# Patient Record
Sex: Female | Born: 2000 | Race: Black or African American | Hispanic: No
Health system: Southern US, Community
[De-identification: ages and names within clinical notes are randomized; demographics above are authoritative.]

## PROBLEM LIST (undated history)

## (undated) ENCOUNTER — Emergency Department (HOSPITAL_COMMUNITY): Admission: EM | Payer: Self-pay | Source: Home / Self Care

## (undated) ENCOUNTER — Ambulatory Visit (HOSPITAL_COMMUNITY): Source: Home / Self Care

## (undated) DIAGNOSIS — Z789 Other specified health status: Secondary | ICD-10-CM

## (undated) HISTORY — PX: DILATION AND CURETTAGE OF UTERUS: SHX78

## (undated) HISTORY — PX: WISDOM TOOTH EXTRACTION: SHX21

## (undated) HISTORY — DX: Other specified health status: Z78.9

---
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
## 2020-04-21 ENCOUNTER — Other Ambulatory Visit: Payer: Self-pay

## 2020-07-30 ENCOUNTER — Ambulatory Visit: Payer: Medicaid Other | Attending: Internal Medicine

## 2020-07-30 DIAGNOSIS — Z23 Encounter for immunization: Secondary | ICD-10-CM

## 2020-07-30 NOTE — Progress Notes (Signed)
   Covid-19 Vaccination Clinic  Name:  [name]    MRN: [id number] DOB: [birth year]  07/30/2020  [name] was observed post Covid-19 immunization for 15 minutes without incident. She was provided with Vaccine Information Sheet and instruction to access the V-Safe system.   [name] was instructed to call 911 with any severe reactions post vaccine: Marland Kitchen Difficulty breathing  . Swelling of face and throat  . A fast heartbeat  . A bad rash all over body  . Dizziness and weakness   Immunizations Administered    Name Date Dose VIS Date Route   Moderna COVID-19 Vaccine 07/30/2020  3:42 PM 0.5 mL 06/27/2020 Intramuscular   Manufacturer: Gala Murdoch   Lot: [id number]   NDC: 00923-300-76

## 2020-08-27 ENCOUNTER — Ambulatory Visit: Payer: Medicaid Other | Attending: Internal Medicine

## 2020-08-27 DIAGNOSIS — Z23 Encounter for immunization: Secondary | ICD-10-CM

## 2020-08-27 NOTE — Progress Notes (Signed)
   Covid-19 Vaccination Clinic  Name:  Ann Lin    MRN: [id number] DOB: 2000/12/08  08/27/2020  [name] was observed post Covid-19 immunization for 15 minutes 15 minutes observation without incident. She was provided with Vaccine Information Sheet and instruction to access the V-Safe system.   [name] was instructed to call 911 with any severe reactions post vaccine: Marland Kitchen Difficulty breathing  . Swelling of face and throat  . A fast heartbeat  . A bad rash all over body  . Dizziness and weakness   Immunizations Administered    Name Date Dose VIS Date Route   Moderna COVID-19 Vaccine 08/27/2020  1:28 PM 0.5 mL 06/27/2020 Intramuscular   Manufacturer: Gala Murdoch   Lot: [id number]   NDC: 67014-103-01

## 2020-09-04 ENCOUNTER — Emergency Department (HOSPITAL_COMMUNITY)
Admission: EM | Admit: 2020-09-04 | Discharge: 2020-09-04 | Disposition: A | Discharge status: Home / Self Care | Payer: Medicaid Other | Attending: Emergency Medicine | Admitting: Emergency Medicine

## 2020-09-04 ENCOUNTER — Other Ambulatory Visit: Payer: Self-pay

## 2020-09-04 DIAGNOSIS — Z202 Contact with and (suspected) exposure to infections with a predominantly sexual mode of transmission: Secondary | ICD-10-CM | POA: Diagnosis not present

## 2020-09-04 DIAGNOSIS — Z20822 Contact with and (suspected) exposure to covid-19: Secondary | ICD-10-CM | POA: Insufficient documentation

## 2020-09-04 DIAGNOSIS — R509 Fever, unspecified: Secondary | ICD-10-CM | POA: Diagnosis not present

## 2020-09-04 DIAGNOSIS — N898 Other specified noninflammatory disorders of vagina: Secondary | ICD-10-CM | POA: Diagnosis not present

## 2020-09-04 DIAGNOSIS — J029 Acute pharyngitis, unspecified: Secondary | ICD-10-CM | POA: Insufficient documentation

## 2020-09-04 DIAGNOSIS — R519 Headache, unspecified: Secondary | ICD-10-CM | POA: Insufficient documentation

## 2020-09-04 DIAGNOSIS — Z711 Person with feared health complaint in whom no diagnosis is made: Secondary | ICD-10-CM

## 2020-09-04 LAB — WET PREP, GENITAL
Clue Cells Wet Prep HPF POC: NONE SEEN
Sperm: NONE SEEN
Trich, Wet Prep: NONE SEEN
Yeast Wet Prep HPF POC: NONE SEEN

## 2020-09-04 LAB — HIV ANTIBODY (ROUTINE TESTING W REFLEX): HIV Screen 4th Generation wRfx: NONREACTIVE

## 2020-09-04 LAB — URINALYSIS, ROUTINE W REFLEX MICROSCOPIC
Bilirubin Urine: NEGATIVE
Glucose, UA: NEGATIVE mg/dL
Ketones, ur: 20 mg/dL — AB
Nitrite: NEGATIVE
Protein, ur: 100 mg/dL — AB
Specific Gravity, Urine: 1.032 — ABNORMAL HIGH (ref 1.005–1.030)
pH: 5 (ref 5.0–8.0)

## 2020-09-04 LAB — I-STAT BETA HCG BLOOD, ED (MC, WL, AP ONLY): I-stat hCG, quantitative: <5 m[IU]/mL (ref <5)

## 2020-09-04 LAB — RESP PANEL BY RT-PCR (FLU A&B, COVID) ARPGX2
Influenza A by PCR: NEGATIVE
Influenza B by PCR: NEGATIVE
SARS Coronavirus 2 by RT PCR: NEGATIVE

## 2020-09-04 MED ORDER — DOXYCYCLINE HYCLATE 100 MG PO TABS: 100.0000 mg | ORAL_TABLET | Freq: Once | ORAL | Status: DC

## 2020-09-04 MED ORDER — ONDANSETRON 4 MG PO TBDP
8.0000 mg | ORAL_TABLET | Freq: Once | ORAL | Status: AC
  Administered 2020-09-04: 22:00:00 8 mg via ORAL
  Filled 2020-09-04: qty 2

## 2020-09-04 MED ORDER — DOXYCYCLINE HYCLATE 100 MG PO CAPS: 100.0000 mg | ORAL_CAPSULE | Freq: Two times a day (BID) | ORAL | 0 refills | Status: AC

## 2020-09-04 MED ORDER — VALACYCLOVIR HCL 1 G PO TABS: 1000.0000 mg | ORAL_TABLET | Freq: Three times a day (TID) | ORAL | 0 refills | Status: DC

## 2020-09-04 MED ORDER — CEFTRIAXONE SODIUM 500 MG IJ SOLR
500.0000 mg | Freq: Once | INTRAMUSCULAR | Status: AC
  Administered 2020-09-04: 22:00:00 500 mg via INTRAMUSCULAR
  Filled 2020-09-04: qty 500

## 2020-09-04 MED ORDER — LIDOCAINE HCL URETHRAL/MUCOSAL 2 % EX GEL: 1.0000 "application " | CUTANEOUS | 0 refills | Status: DC | PRN

## 2020-09-04 MED ORDER — LIDOCAINE HCL (PF) 1 % IJ SOLN
1.0000 mL | Freq: Once | INTRAMUSCULAR | Status: AC
  Administered 2020-09-04: 22:00:00 1 mL

## 2020-09-04 MED ORDER — VALACYCLOVIR HCL 500 MG PO TABS: 1000.0000 mg | ORAL_TABLET | Freq: Once | ORAL | Status: DC

## 2020-09-04 MED ORDER — FLUCONAZOLE 150 MG PO TABS
150.0000 mg | ORAL_TABLET | Freq: Once | ORAL | Status: AC
  Administered 2020-09-04: 22:00:00 150 mg via ORAL
  Filled 2020-09-04: qty 1

## 2020-09-04 NOTE — ED Triage Notes (Signed)
Pt reports fever, chills, sore throat, headache x 2 days. Also endorses vaginal pain and discharge and would like STD testing.

## 2020-09-04 NOTE — ED Provider Notes (Signed)
[facility] EMERGENCY DEPARTMENT Provider Note   CSN: [id number] Arrival date & time: 09/04/20  1608     History Chief Complaint  Patient presents with  . Sore Throat  . Exposure to STD    Ann Lin is a 19 y.o. female with no known past medical history.  HPI Patient presents to emergency department today with chief complaint of sore throat and exposure to STD x2 days.  Patient states she has had subjective fever and chills, sore throat and headache.  She states those symptoms solved earlier today without intervention.  Asking for a Covid test.  No known sick contacts or Covid exposures.  She is reporting vaginal discharge x2 days as well.  She is concerned for possible STD.  She is sexually active with 1 female partner with protection.  She states she noticed bumps on her vagina and she has associated dysuria.  She has thick white vaginal discharge also.  She has not tried any over-the-counter medications for symptoms prior to arrival.  No history of similar symptoms. Denies abdominal pain, pelvic pain, abnormal vaginal bleeding, gross hematuria, urinary frequency, rash.    No past medical history on file.  There are no problems to display for this patient.   No past surgical history on file.   OB History   No obstetric history on file.     No family history on file.  Social History   Tobacco Use  . Smoking status: Never Smoker  . Smokeless tobacco: Never Used  Substance Use Topics  . Alcohol use: No    Home Medications Prior to Admission medications   Medication Sig Start Date End Date Taking? Authorizing Provider  doxycycline (VIBRAMYCIN) 100 MG capsule Take 1 capsule (100 mg total) by mouth 2 (two) times daily for 7 days. 09/04/20 09/11/20 Yes Walisiewicz, Saiquan Hands E, PA-C  lidocaine (XYLOCAINE) 2 % jelly Apply 1 application topically as needed. 09/04/20  Yes Walisiewicz, Nolita Kutter E, PA-C  valACYclovir (VALTREX) 1000 MG tablet Take 1 tablet  (1,000 mg total) by mouth 3 (three) times daily. 09/04/20  Yes Walisiewicz, Zanaya Baize E, PA-C  Acetaminophen (TYLENOL PO) Take by mouth.    [provider]  dicyclomine (BENTYL) 10 MG capsule Take 1 capsule (10 mg total) by mouth 4 (four) times daily as needed (abdominal pain). 05/01/16 05/15/16  Phineas Semen, MD    Allergies    Patient has no known allergies.  Review of Systems   Review of Systems All other systems are reviewed and are negative for acute change except as noted in the HPI.  Physical Exam Updated Vital Signs BP 112/77   Pulse (!) 117   Temp 99.6 F (37.6 C) (Oral)   Resp 17   LMP 08/28/2020 (Exact Date)   SpO2 99%   Physical Exam Vitals and nursing note reviewed.  Constitutional:      General: She is not in acute distress.    Appearance: She is not ill-appearing.  HENT:     Head: Normocephalic and atraumatic.     Right Ear: Tympanic membrane and external ear normal.     Left Ear: Tympanic membrane and external ear normal.     Nose: Nose normal.     Mouth/Throat:     Mouth: Mucous membranes are moist.     Pharynx: Oropharynx is clear.     Comments: No erythema to oropharynx, no edema, no exudate, no tonsillar swelling, voice normal, neck supple without lymphadenopathy  Eyes:  General: No scleral icterus.       Right eye: No discharge.        Left eye: No discharge.     Extraocular Movements: Extraocular movements intact.     Conjunctiva/sclera: Conjunctivae normal.     Pupils: Pupils are equal, round, and reactive to light.  Neck:     Vascular: No JVD.  Cardiovascular:     Rate and Rhythm: Normal rate and regular rhythm.     Pulses: Normal pulses.          Radial pulses are 2+ on the right side and 2+ on the left side.     Heart sounds: Normal heart sounds.  Pulmonary:     Comments: Lungs clear to auscultation in all fields. Symmetric chest rise. No wheezing, rales, or rhonchi. Abdominal:     Comments: Abdomen is soft, non-distended, and  non-tender in all quadrants. No rigidity, no guarding. No peritoneal signs.  Genitourinary:    Comments: Multiple small ulcerations seen on vulva that are tender to palpation.No pain with speculum insertion. Closed cervical os with normal appearance - no rash or lesions. No significant bleeding noted from cervix or in vaginal vault.  Thick white discharge seen in vaginal vault. On bimanual examination no adnexal tenderness or cervical motion tenderness. Chaperone Daija RN present during exam.  Musculoskeletal:        General: Normal range of motion.     Cervical back: Normal range of motion.  Skin:    General: Skin is warm and dry.     Capillary Refill: Capillary refill takes less than 2 seconds.  Neurological:     Mental Status: She is oriented to person, place, and time.     GCS: GCS eye subscore is 4. GCS verbal subscore is 5. GCS motor subscore is 6.     Comments: Fluent speech, no facial droop.  Psychiatric:        Behavior: Behavior normal.     ED Results / Procedures / Treatments   Labs (all labs ordered are listed, but only abnormal results are displayed) Labs Reviewed  WET PREP, GENITAL - Abnormal; Notable for the following components:      Result Value   WBC, Wet Prep HPF POC MANY (*)    All other components within normal limits  URINALYSIS, ROUTINE W REFLEX MICROSCOPIC - Abnormal; Notable for the following components:   Color, Urine AMBER (*)    APPearance HAZY (*)    Specific Gravity, Urine 1.032 (*)    Hgb urine dipstick SMALL (*)    Ketones, ur 20 (*)    Protein, ur 100 (*)    Leukocytes,Ua MODERATE (*)    Bacteria, UA FEW (*)    All other components within normal limits  RESP PANEL BY RT-PCR (FLU A&B, COVID) ARPGX2  URINE CULTURE  RPR  HIV ANTIBODY (ROUTINE TESTING W REFLEX)  I-STAT BETA HCG BLOOD, ED ([facility], [facility], [facility] ONLY)  GC/CHLAMYDIA PROBE AMP ([place]) NOT AT [facility]    EKG None  Radiology No results found.  Procedures Procedures (including  critical care time)  Medications Ordered in ED Medications  fluconazole (DIFLUCAN) tablet 150 mg (150 mg Oral Given 09/04/20 2213)  cefTRIAXone (ROCEPHIN) injection 500 mg (500 mg Intramuscular Given 09/04/20 2213)  lidocaine (PF) (XYLOCAINE) 1 % injection 1 mL (1 mL Other Given 09/04/20 2213)  ondansetron (ZOFRAN-ODT) disintegrating tablet 8 mg (8 mg Oral Given 09/04/20 2213)    ED Course  I have reviewed the triage vital  signs and the nursing notes.  Pertinent labs & imaging results that were available during my care of the patient were reviewed by me and considered in my medical decision making (see chart for details).    MDM Rules/Calculators/A&P                          History provided by patient with additional history obtained from chart review.    19 year old female presenting with possible Covid symptoms and STD exposure.  She is afebrile.  She was noted to be tachycardic to 125 in triage.  On my exam patient is very anxious.  Heart rate checked and is in the 90s.  Suspect tachycardia related to anxiety.  She has no abdominal tenderness.  No signs of strep throat on exam.  No signs of peritonsillar abscess either.  Covid and influenza test are negative.  Abdominal tenderness.  No peritoneal signs.  No CVA tenderness. Pelvic exam performed with chaperone present.  Wet prep is only showing many WBCs.  Clinically she has a yeast infection she has thick white discharge, will treat with Diflucan.  She has ulcerations on the vulva consistent with herpes.  No cervical motion tenderness, no adnexal tenderness.  PID unlikely.  Patient given IM Rocephin given for prophylactic gonorrhea treatment. UA with moderate leukocytes, 21-50 WBC, few bacteria.  Urine culture sent.  We will hold off on antibiotics for UTI as she is being discharged with multiple medications and her urinary symptoms could be consistent with her possible herpes.  Discharged with prescription for doxycycline for prophylactic  immediate treatment.  Patient advised to take waiting for test results.  We will also discharged with prescription for Valtrex.  Patient has appointment scheduled with her GYN next week.  Recommend she keep this for for evaluation of her symptoms.  Patient knows she will need to inform partners if STD testing is positive for so they can be treated as well.  The patient appears reasonably screened and/or stabilized for discharge and I doubt any other medical condition or other [facility] requiring further screening, evaluation, or treatment in the ED at this time prior to discharge. The patient is safe for discharge with strict return precautions discussed.   Orella A Noel GeroldCohen was evaluated in Emergency Department on 09/04/2020 for the symptoms described in the history of present illness. She was evaluated in the context of the global COVID-19 pandemic, which necessitated consideration that the patient might be at risk for infection with the SARS-CoV-2 virus that causes COVID-19. Institutional protocols and algorithms that pertain to the evaluation of patients at risk for COVID-19 are in a state of rapid change based on information released by regulatory bodies including the CDC and federal and state organizations. These policies and algorithms were followed during the patient's care in the ED.   Portions of this note were generated with Scientist, clinical (histocompatibility and immunogenetics)Dragon dictation software. Dictation errors may occur despite best attempts at proofreading.   Final Clinical Impression(s) / ED Diagnoses Final diagnoses:  Concern about STD in female without diagnosis    Rx / DC Orders ED Discharge Orders         Ordered    lidocaine (XYLOCAINE) 2 % jelly  As needed        09/04/20 2213    doxycycline (VIBRAMYCIN) 100 MG capsule  2 times daily        09/04/20 2213    valACYclovir (VALTREX) 1000 MG tablet  3 times daily  09/04/20 2213           Shanon Ace, PA-C 09/04/20 2237    Melene Plan, DO 09/04/20  2249

## 2020-09-04 NOTE — Discharge Instructions (Addendum)
Your Covid and flu tests are negative.  -Prescription sent to the pharmacy for doxycycline.  This is prophylactic medicine for chlamydia.  You should take this into you have the chlamydia test result.  If the result is positive you will receive a phone call.  If the chlamydia test is negative you can stop taking the doxycycline.  -Prescription sent to the pharmacy for Valtrex.  This is for herpes.  Take as prescribed.   -You are already treated for possible gonorrhea.  That was the shot you received.  It is a one-time dose.  -You received Diflucan.  This is used to treat yeast infections.  It is a one-time dose.  -Prescription sent to the pharmacy for lidocaine jelly.  You can apply this to the outside of your vagina for pain if needed.  -If your STD tests are positive you need to tell your partner so they can also be treated.  Keep your follow-up appointment scheduled with your OB/GYN doctor for further evaluation of your symptoms.  I have included information about genital herpes in your discharge paperwork.  You were not officially diagnosed with herpes today.

## 2020-09-05 LAB — GC/CHLAMYDIA PROBE AMP (~~LOC~~) NOT AT ARMC
Chlamydia: NEGATIVE
Comment: NEGATIVE
Comment: NORMAL
Neisseria Gonorrhea: NEGATIVE

## 2020-09-05 LAB — RPR: RPR Ser Ql: NONREACTIVE

## 2020-09-06 LAB — URINE CULTURE: Culture: NO GROWTH

## 2021-07-17 ENCOUNTER — Other Ambulatory Visit: Payer: Self-pay

## 2021-07-17 ENCOUNTER — Ambulatory Visit (HOSPITAL_COMMUNITY)
Admission: EM | Admit: 2021-07-17 | Discharge: 2021-07-17 | Disposition: A | Discharge status: Home / Self Care | Payer: Medicaid Other | Attending: Physician Assistant | Admitting: Physician Assistant

## 2021-07-17 ENCOUNTER — Encounter (HOSPITAL_COMMUNITY): Payer: Self-pay

## 2021-07-17 DIAGNOSIS — S6992XA Unspecified injury of left wrist, hand and finger(s), initial encounter: Secondary | ICD-10-CM

## 2021-07-17 DIAGNOSIS — Z23 Encounter for immunization: Secondary | ICD-10-CM

## 2021-07-17 DIAGNOSIS — S61432A Puncture wound without foreign body of left hand, initial encounter: Secondary | ICD-10-CM

## 2021-07-17 MED ORDER — MUPIROCIN 2 % EX OINT: 1.0000 "application " | TOPICAL_OINTMENT | Freq: Every day | CUTANEOUS | 0 refills | Status: DC

## 2021-07-17 MED ORDER — TETANUS-DIPHTH-ACELL PERTUSSIS 5-2.5-18.5 LF-MCG/0.5 IM SUSY
0.5000 mL | PREFILLED_SYRINGE | Freq: Once | INTRAMUSCULAR | Status: AC
  Administered 2021-07-17: 21:00:00 0.5 mL via INTRAMUSCULAR

## 2021-07-17 MED ORDER — CEPHALEXIN 500 MG PO CAPS: 500.0000 mg | ORAL_CAPSULE | Freq: Four times a day (QID) | ORAL | 0 refills | Status: DC

## 2021-07-17 MED ORDER — TETANUS-DIPHTH-ACELL PERTUSSIS 5-2.5-18.5 LF-MCG/0.5 IM SUSY
PREFILLED_SYRINGE | INTRAMUSCULAR | Status: AC
  Filled 2021-07-17: qty 0.5

## 2021-07-17 NOTE — Discharge Instructions (Signed)
I cannot feel any foreign body in your hand.  We updated your tetanus today.  We are going to cover for an infection.  Please take Keflex 4 times a day.  Keep area clean and apply Bactroban ointment.  If your symptoms or not improving please follow-up hand provider as we discussed as they can use an ultrasound to make sure there is nothing still in there.  If you have any severe symptoms please return for reevaluation.

## 2021-07-17 NOTE — ED Provider Notes (Signed)
[facility]    CSN: [id number] Arrival date & time: 07/17/21  1922      History   Chief Complaint Chief Complaint  Patient presents with   Foreign Body in Skin    HPI SHALON SALADO is a 20 y.o. female.   Patient presents today with a several hour history of left hand pain following injury.  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  She attempted to remove this but was unable to feel or remove foreign body.  She reports pain is rated 8 on a 0-10 pain scale, localized to affected area, described aching, no aggravating relieving factors identified.  Denies any numbness, swelling, paresthesias.  She is right-handed.  She is unsure when her last tetanus was.   History reviewed. No pertinent past medical history.  There are no problems to display for this patient.   History reviewed. No pertinent surgical history.  OB History   No obstetric history on file.      Home Medications    Prior to Admission medications   Medication Sig Start Date End Date Taking? Authorizing Provider  cephALEXin (KEFLEX) 500 MG capsule Take 1 capsule (500 mg total) by mouth 4 (four) times daily. 07/17/21  Yes Ronald Londo, Denny Peon K, PA-C  mupirocin ointment (BACTROBAN) 2 % Apply 1 application topically daily. 07/17/21  Yes Leyda Vanderwerf K, PA-C  Acetaminophen (TYLENOL PO) Take by mouth.    [provider]  dicyclomine (BENTYL) 10 MG capsule Take 1 capsule (10 mg total) by mouth 4 (four) times daily as needed (abdominal pain). 05/01/16 05/15/16  Phineas Semen, MD  lidocaine (XYLOCAINE) 2 % jelly Apply 1 application topically as needed. 09/04/20   Walisiewicz, Caroleen Hamman, PA-C  valACYclovir (VALTREX) 1000 MG tablet Take 1 tablet (1,000 mg total) by mouth 3 (three) times daily. 09/04/20   Shanon Ace, PA-C    Family History Family History  Problem Relation Age of Onset   Healthy  Mother    Healthy Father     Social History Social History   Tobacco Use   Smoking status: Never   Smokeless tobacco: Never  Substance Use Topics   Alcohol use: No     Allergies   Patient has no known allergies.   Review of Systems Review of Systems  Constitutional:  Negative for activity change, appetite change, fatigue and fever.  Respiratory:  Negative for cough and shortness of breath.   Cardiovascular:  Negative for chest pain.  Gastrointestinal:  Negative for abdominal pain, diarrhea, nausea and vomiting.  Musculoskeletal:  Negative for arthralgias and myalgias.  Skin:  Positive for wound. Negative for color change.  Neurological:  Negative for dizziness, weakness, light-headedness, numbness and headaches.    Physical Exam Triage Vital Signs ED Triage Vitals [07/17/21 2018]  Enc Vitals Group     BP (!) 126/92     Pulse Rate 90     Resp 19     Temp 98.3 F (36.8 C)     Temp src      SpO2 98 %     Weight      Height      Head Circumference      Peak Flow      Pain Score 8     Pain Loc      Pain Edu?      Excl. in GC?  No data found.  Updated Vital Signs BP (!) 126/92   Pulse 90   Temp 98.3 F (36.8 C)   Resp 19   LMP 07/05/2021   SpO2 98%   Visual Acuity Right Eye Distance:   Left Eye Distance:   Bilateral Distance:    Right Eye Near:   Left Eye Near:    Bilateral Near:     Physical Exam Vitals reviewed.  Constitutional:      General: She is awake. She is not in acute distress.    Appearance: Normal appearance. She is well-developed. She is not ill-appearing.     Comments: Very pleasant female appears stated age no acute distress  HENT:     Head: Normocephalic and atraumatic.  Cardiovascular:     Rate and Rhythm: Normal rate and regular rhythm.     Pulses:          Radial pulses are 2+ on the right side and 2+ on the left side.     Heart sounds: Normal heart sounds, S1 normal and S2 normal. No murmur heard.    Comments:  Capillary refill within 2 seconds left fingers Pulmonary:     Effort: Pulmonary effort is normal.     Breath sounds: Normal breath sounds. No wheezing, rhonchi or rales.     Comments: Clear to auscultation bilaterally Musculoskeletal:     Left hand: Tenderness present. No swelling or bony tenderness. Normal range of motion. There is no disruption of two-point discrimination. Normal capillary refill.     Comments: Left hand: Mild times palpation over small wound on palmar surface of left hand.  No palpable foreign body.  Multiple palpation over affected area.  No erythema, bleeding, drainage noted.  Hand neurovascularly intact.  Psychiatric:        Behavior: Behavior is cooperative.     UC Treatments / Results  Labs (all labs ordered are listed, but only abnormal results are displayed) Labs Reviewed - No data to display  EKG   Radiology No results found.  Procedures Procedures (including critical care time)  Medications Ordered in UC Medications  Tdap (BOOSTRIX) injection 0.5 mL (0.5 mLs Intramuscular Given 07/17/21 2045)    Initial Impression / Assessment and Plan / UC Course  I have reviewed the triage vital signs and the nursing notes.  Pertinent labs & imaging results that were available during my care of the patient were reviewed by me and considered in my medical decision making (see chart for details).     Used forceps to widen entrance of wound without palpation or visualization of foreign body.  Low suspicion for retained foreign body patient was given contact information for hand specialist encouraged to follow-up if symptoms or not improving quickly within a week to evaluate her with ultrasound which we do not have in urgent care.  Plain x-ray was deferred as suspicion for radiopaque foreign body was low.  Will cover for infection given puncture wound with Keflex and Bactroban.  Tetanus was updated today.  Discussed alarm symptoms that warrant emergent evaluation.   Strict return precautions given to which she expressed understanding.  Final Clinical Impressions(s) / UC Diagnoses   Final diagnoses:  Injury of left hand, initial encounter  Puncture wound of left hand, foreign body presence unspecified, initial encounter     Discharge Instructions      I cannot feel any foreign body in your hand.  We updated your tetanus today.  We are going to cover for an infection.  Please take Keflex 4 times a day.  Keep area clean and apply Bactroban ointment.  If your symptoms or not improving please follow-up hand provider as we discussed as they can use an ultrasound to make sure there is nothing still in there.  If you have any severe symptoms please return for reevaluation.     ED Prescriptions     Medication Sig Dispense Auth. Provider   mupirocin ointment (BACTROBAN) 2 % Apply 1 application topically daily. 22 g Cassi Jenne K, PA-C   cephALEXin (KEFLEX) 500 MG capsule Take 1 capsule (500 mg total) by mouth 4 (four) times daily. 20 capsule Jatasia Gundrum, Noberto Retort, PA-C      PDMP not reviewed this encounter.   Jeani Hawking, PA-C 07/17/21 2055

## 2021-07-17 NOTE — ED Triage Notes (Signed)
Pt believes she has splinter in left hand. Reports that the pain has increased over the day.

## 2021-07-17 NOTE — ED Notes (Signed)
Called in lobby, no answered.  °

## 2021-08-17 ENCOUNTER — Encounter (HOSPITAL_COMMUNITY): Payer: Self-pay

## 2021-08-27 ENCOUNTER — Ambulatory Visit: Payer: Self-pay | Admitting: Adult Health

## 2021-08-27 ENCOUNTER — Encounter: Payer: Self-pay | Admitting: Adult Health

## 2021-08-27 ENCOUNTER — Encounter: Payer: Self-pay | Admitting: Pediatrics

## 2021-10-30 ENCOUNTER — Ambulatory Visit: Payer: Medicaid Other | Admitting: Adult Health

## 2022-01-09 ENCOUNTER — Emergency Department (HOSPITAL_COMMUNITY)
Admission: EM | Admit: 2022-01-09 | Discharge: 2022-01-09 | Discharge status: Against Medical Advice | Payer: Medicaid Other | Attending: Student | Admitting: Student

## 2022-01-09 ENCOUNTER — Encounter (HOSPITAL_COMMUNITY): Payer: Self-pay | Admitting: Emergency Medicine

## 2022-01-09 ENCOUNTER — Ambulatory Visit: Payer: Medicaid Other

## 2022-01-09 ENCOUNTER — Ambulatory Visit (HOSPITAL_COMMUNITY)
Admission: EM | Admit: 2022-01-09 | Discharge: 2022-01-09 | Disposition: A | Discharge status: Home / Self Care | Payer: Medicaid Other | Attending: Internal Medicine | Admitting: Internal Medicine

## 2022-01-09 ENCOUNTER — Other Ambulatory Visit: Payer: Self-pay

## 2022-01-09 DIAGNOSIS — B302 Viral pharyngoconjunctivitis: Secondary | ICD-10-CM

## 2022-01-09 DIAGNOSIS — R0981 Nasal congestion: Secondary | ICD-10-CM | POA: Diagnosis not present

## 2022-01-09 DIAGNOSIS — R059 Cough, unspecified: Secondary | ICD-10-CM | POA: Insufficient documentation

## 2022-01-09 DIAGNOSIS — J029 Acute pharyngitis, unspecified: Secondary | ICD-10-CM | POA: Insufficient documentation

## 2022-01-09 DIAGNOSIS — Z5321 Procedure and treatment not carried out due to patient leaving prior to being seen by health care provider: Secondary | ICD-10-CM | POA: Diagnosis not present

## 2022-01-09 DIAGNOSIS — Z20822 Contact with and (suspected) exposure to covid-19: Secondary | ICD-10-CM | POA: Insufficient documentation

## 2022-01-09 LAB — GROUP A STREP BY PCR: Group A Strep by PCR: NOT DETECTED

## 2022-01-09 LAB — RESP PANEL BY RT-PCR (FLU A&B, COVID) ARPGX2
Influenza A by PCR: NEGATIVE
Influenza B by PCR: NEGATIVE
SARS Coronavirus 2 by RT PCR: NEGATIVE

## 2022-01-09 MED ORDER — DEXAMETHASONE SODIUM PHOSPHATE 10 MG/ML IJ SOLN: 10.0000 mg | Freq: Once | INTRAMUSCULAR | Status: DC

## 2022-01-09 MED ORDER — MOUTHWASH COMPOUNDING BASE PO LIQD: 15.0000 mL | Freq: Four times a day (QID) | ORAL | 0 refills | Status: DC | PRN

## 2022-01-09 MED ORDER — OLOPATADINE HCL 0.1 % OP SOLN: 1.0000 [drp] | Freq: Two times a day (BID) | OPHTHALMIC | 12 refills | Status: AC

## 2022-01-09 MED ORDER — PHENOL 1.4 % MT LIQD: 1.0000 | OROMUCOSAL | 0 refills | Status: DC | PRN

## 2022-01-09 MED ORDER — FLUORESCEIN SODIUM 1 MG OP STRP
ORAL_STRIP | OPHTHALMIC | Status: AC
Start: 2022-01-09 — End: ?
  Filled 2022-01-09: qty 1

## 2022-01-09 MED ORDER — TETRACAINE HCL 0.5 % OP SOLN
OPHTHALMIC | Status: AC
  Filled 2022-01-09: qty 4

## 2022-01-09 MED ORDER — EYE WASH OPHTH SOLN
OPHTHALMIC | Status: AC
  Filled 2022-01-09: qty 118

## 2022-01-09 MED ORDER — ERYTHROMYCIN 5 MG/GM OP OINT
TOPICAL_OINTMENT | Freq: Two times a day (BID) | OPHTHALMIC | 0 refills | Status: AC
Start: 2022-01-09 — End: 2022-01-14

## 2022-01-09 NOTE — ED Notes (Signed)
Pt handed stickers over to registration and stated that she was leaving.  ?

## 2022-01-09 NOTE — ED Triage Notes (Addendum)
[facility] went [facility] and was tested for covid and strep and told both were negative.  Swabbed in ED last night (LWBS) and did not get that result ? ?Patient was prescribed medicine for eyes-red, draining-has not picked up medicine ?

## 2022-01-09 NOTE — ED Provider Triage Note (Signed)
Emergency Medicine Provider Triage Evaluation Note ? ?Ann Lin , a 21 y.o. female  was evaluated in triage.  Pt complains of sore throat x2 weeks.  Also having associated nasal congestion and cough.  Was seen by PCP yesterday, negative COVID and strep in office.  She was given ointment for bilateral bacterial conjunctivitis.  States pain was so severe this morning she had to come to the ED, nothing prior to arrival.. ? ?Review of Systems  ?Per HPI ? ?Physical Exam  ?BP (!) 143/101 (BP Location: Right Arm)   Pulse (!) 105   Temp 98.5 ?F (36.9 ?C) (Oral)   Resp (!) 22   SpO2 99%  ?Gen:   Awake, patient is tearful ?Resp:  Normal effort  ?MSK:   Moves extremities without difficulty  ?Other:  Normal phonation.  Uvula is midline, tonsillar use 3+ bilaterally. ? ?Medical Decision Making  ?Medically screening exam initiated at 6:26 AM.  Appropriate orders placed.  Ann Lin was informed that the remainder of the evaluation will be completed by another provider, this initial triage assessment does not replace that evaluation, and the importance of remaining in the ED until their evaluation is complete. ? ?Prdered Decadron for symptoms, will repeat strep and COVID.   ?  ?Theron Arista, PA-C ?01/09/22 6606 ? ?

## 2022-01-09 NOTE — ED Provider Notes (Signed)
?[facility] ? ? ? ?CSN: [id number] ?Arrival date & time: 01/09/22  1779 ? ? ?  ? ?History   ?Chief Complaint ?Chief Complaint  ?Patient presents with  ? Sore Throat  ? ? ?HPI ?Ann Lin is a 21 y.o. female comes to urgent care with 2-week history of itchy throat.  Patient denies any history of seasonal allergies.  Patient's symptoms were persistent and subsequently she developed a sore throat.  Sore throat has worsened from intermittent pain associated with swallowing to a constant pain.  She has tried Chloraseptic throat spray with no improvement in pain.  Few days ago she noticed eye redness with some yellowish discharge especially in the mornings.  No febrile episodes.  No sick contacts.  Patient has a cough which is nonproductive.  No dizziness, near syncope or syncopal episodes.  No rashes noted.  Patient has some itchy eyes. ? ?Patient was evaluated in another urgent care and she was negative for COVID-19, flu and strep. ? ?HPI ? ?History reviewed. No pertinent past medical history. ? ?There are no problems to display for this patient. ? ? ?History reviewed. No pertinent surgical history. ? ?OB History   ?No obstetric history on file. ?  ? ? ? ?Home Medications   ? ?Prior to Admission medications   ?Medication Sig Start Date End Date Taking? Authorizing Provider  ?erythromycin ophthalmic ointment Place into both eyes in the morning and at bedtime for 5 days. Place a 1/2 inch ribbon of ointment into the lower eyelid. 01/09/22 01/14/22 Yes Karah Caruthers, Britta Mccreedy, MD  ?Mouthwash Compounding Base LIQD Swish and swallow 15 mLs 4 (four) times daily as needed. Compounding formula: Benadryl 12.5 mg/mL - 80 mL; viscous lidocaine 2%- 71ml; Maalox 110ml 01/09/22  Yes Prudencio Velazco, Britta Mccreedy, MD  ?olopatadine (PATANOL) 0.1 % ophthalmic solution Place 1 drop into both eyes 2 (two) times daily for 7 days. 01/09/22 01/16/22 Yes Charene Mccallister, Britta Mccreedy, MD  ?phenol (CHLORASEPTIC) 1.4 % LIQD Use as directed 1 spray in the mouth or throat  as needed for throat irritation / pain. 01/09/22  Yes Keean Wilmeth, Britta Mccreedy, MD  ?Acetaminophen (TYLENOL PO) Take by mouth.    [provider]  ?dicyclomine (BENTYL) 10 MG capsule Take 1 capsule (10 mg total) by mouth 4 (four) times daily as needed (abdominal pain). 05/01/16 05/15/16  Phineas Semen, MD  ?valACYclovir (VALTREX) 1000 MG tablet Take 1 tablet (1,000 mg total) by mouth 3 (three) times daily. 09/04/20   Shanon Ace, PA-C  ? ? ?Family History ?Family History  ?Problem Relation Age of Onset  ? Healthy Mother   ? Healthy Father   ? ? ?Social History ?Social History  ? ?Tobacco Use  ? Smoking status: Never  ? Smokeless tobacco: Never  ?Vaping Use  ? Vaping Use: Never used  ?Substance Use Topics  ? Alcohol use: No  ? Drug use: Not Currently  ? ? ? ?Allergies   ?Patient has no known allergies. ? ? ?Review of Systems ?Review of Systems  ?Respiratory: Negative.    ?Gastrointestinal: Negative.   ?Musculoskeletal: Negative.   ?Skin:  Positive for rash. Negative for color change, pallor and wound.  ?Neurological: Negative.   ? ? ?Physical Exam ?Triage Vital Signs ?ED Triage Vitals  ?Enc Vitals Group  ?   BP 01/09/22 0950 121/86  ?   Pulse Rate 01/09/22 0950 89  ?   Resp 01/09/22 0950 (!) 22  ?   Temp 01/09/22 0950 98.2 ?F (36.8 ?C)  ?  Temp Source 01/09/22 0950 Oral  ?   SpO2 01/09/22 0950 97 %  ?   Weight --   ?   Height --   ?   Head Circumference --   ?   Peak Flow --   ?   Pain Score 01/09/22 0944 10  ?   Pain Loc --   ?   Pain Edu? --   ?   Excl. in GC? --   ? ?No data found. ? ?Updated Vital Signs ?BP 121/86 (BP Location: Right Arm)   Pulse 89   Temp 98.2 ?F (36.8 ?C) (Oral)   Resp (!) 22   LMP 12/24/2021 (Approximate)   SpO2 97%  ? ?Visual Acuity ?Right Eye Distance:   ?Left Eye Distance:   ?Bilateral Distance:   ? ?Right Eye Near:   ?Left Eye Near:    ?Bilateral Near:    ? ?Physical Exam ?Vitals and nursing note reviewed.  ?Constitutional:   ?   General: She is not in acute distress. ?    Appearance: She is ill-appearing.  ?HENT:  ?   Right Ear: Tympanic membrane normal. No tenderness.  ?   Left Ear: Tympanic membrane normal. No tenderness.  ?   Mouth/Throat:  ?   Mouth: Mucous membranes are moist.  ?   Pharynx: Posterior oropharyngeal erythema present.  ?   Tonsils: No tonsillar exudate.  ?Cardiovascular:  ?   Rate and Rhythm: Normal rate and regular rhythm.  ?Neurological:  ?   Mental Status: She is alert.  ? ? ? ?UC Treatments / Results  ?Labs ?(all labs ordered are listed, but only abnormal results are displayed) ?Labs Reviewed - No data to display ? ?EKG ? ? ?Radiology ?No results found. ? ?Procedures ?Procedures (including critical care time) ? ?Medications Ordered in UC ?Medications - No data to display ? ?Initial Impression / Assessment and Plan / UC Course  ?I have reviewed the triage vital signs and the nursing notes. ? ?Pertinent labs & imaging results that were available during my care of the patient were reviewed by me and considered in my medical decision making (see chart for details). ? ?  ? ?1.  Viral fungal conjunctivitis: ?This is suspected adenovirus ?Patanol I drops ?Erythromycin eye ointment ?Fluorescein staining was negative for corneal ulcerations ?Mouthwash with lidocaine to gargle or swish and spit. ?Continue warm salt water gargle and Chloraseptic throat spray. ?Return to urgent care if symptoms worsen. ?Final Clinical Impressions(s) / UC Diagnoses  ? ?Final diagnoses:  ?Viral pharyngoconjunctivitis  ? ? ? ?Discharge Instructions   ? ?  ?Please use eyedrops and eye ointment as prescribed ?Use mouthwash as prescribed ?Tylenol/Motrin as needed for pain and/or fever ?Strict hand hygiene ?Return to urgent care if you have blurry vision, double vision, or continuous purulent discharge from the eyes. ? ? ?ED Prescriptions   ? ? Medication Sig Dispense Auth. Provider  ? olopatadine (PATANOL) 0.1 % ophthalmic solution Place 1 drop into both eyes 2 (two) times daily for 7 days. 5 mL  Demaris Leavell, Britta MccreedyPhilip O, MD  ? phenol (CHLORASEPTIC) 1.4 % LIQD Use as directed 1 spray in the mouth or throat as needed for throat irritation / pain. 236 mL Landry Kamath, Britta MccreedyPhilip O, MD  ? erythromycin ophthalmic ointment Place into both eyes in the morning and at bedtime for 5 days. Place a 1/2 inch ribbon of ointment into the lower eyelid. 3.5 g Marcellus Pulliam, Britta MccreedyPhilip O, MD  ? [facility] LIQD Swish and swallow  15 mLs 4 (four) times daily as needed. Compounding formula: Benadryl 12.5 mg/mL - 80 mL; viscous lidocaine 2%- 39ml; Maalox 28ml 240 mL Maxcine Strong, Britta Mccreedy, MD  ? ?  ? ?PDMP not reviewed this encounter. ?  ?Merrilee Jansky, MD ?01/09/22 1350 ? ?

## 2022-01-09 NOTE — ED Triage Notes (Signed)
2 weeks ago had an itchy throat, then it started hurting, last night patient woke with severe pain.  Patient states this does not feel like a normal sore throat.  Denies fever.  Patient reports runny nose and cough, denies ear pain ?

## 2022-01-09 NOTE — Discharge Instructions (Addendum)
Please use eyedrops and eye ointment as prescribed ?Use mouthwash as prescribed ?Tylenol/Motrin as needed for pain and/or fever ?Strict hand hygiene ?Return to urgent care if you have blurry vision, double vision, or continuous purulent discharge from the eyes. ?

## 2022-01-09 NOTE — ED Triage Notes (Signed)
Pt c/o sore throat x 2 weeks

## 2022-12-09 ENCOUNTER — Encounter (HOSPITAL_COMMUNITY): Payer: Self-pay | Admitting: *Deleted

## 2022-12-09 ENCOUNTER — Ambulatory Visit (INDEPENDENT_AMBULATORY_CARE_PROVIDER_SITE_OTHER)
Admission: EM | Admit: 2022-12-09 | Discharge: 2022-12-09 | Disposition: A | Discharge status: Home / Self Care | Payer: BC Managed Care – PPO | Source: Home / Self Care

## 2022-12-09 ENCOUNTER — Other Ambulatory Visit: Payer: Self-pay

## 2022-12-09 ENCOUNTER — Encounter (HOSPITAL_COMMUNITY): Payer: Self-pay | Admitting: Emergency Medicine

## 2022-12-09 ENCOUNTER — Emergency Department (HOSPITAL_COMMUNITY)
Admission: EM | Admit: 2022-12-09 | Discharge: 2022-12-09 | Disposition: A | Discharge status: Home / Self Care | Payer: BC Managed Care – PPO | Attending: Emergency Medicine | Admitting: Emergency Medicine

## 2022-12-09 DIAGNOSIS — X17XXXA Contact with hot engines, machinery and tools, initial encounter: Secondary | ICD-10-CM | POA: Diagnosis not present

## 2022-12-09 DIAGNOSIS — T24231A Burn of second degree of right lower leg, initial encounter: Secondary | ICD-10-CM | POA: Insufficient documentation

## 2022-12-09 DIAGNOSIS — R1032 Left lower quadrant pain: Secondary | ICD-10-CM | POA: Diagnosis not present

## 2022-12-09 DIAGNOSIS — R1030 Lower abdominal pain, unspecified: Secondary | ICD-10-CM

## 2022-12-09 DIAGNOSIS — L03115 Cellulitis of right lower limb: Secondary | ICD-10-CM | POA: Diagnosis not present

## 2022-12-09 DIAGNOSIS — N939 Abnormal uterine and vaginal bleeding, unspecified: Secondary | ICD-10-CM | POA: Diagnosis not present

## 2022-12-09 DIAGNOSIS — R109 Unspecified abdominal pain: Secondary | ICD-10-CM | POA: Diagnosis not present

## 2022-12-09 DIAGNOSIS — R1031 Right lower quadrant pain: Secondary | ICD-10-CM | POA: Diagnosis not present

## 2022-12-09 LAB — COMPREHENSIVE METABOLIC PANEL
ALT: 8 U/L (ref 0–44)
AST: 20 U/L (ref 15–41)
Albumin: 3.5 g/dL (ref 3.5–5.0)
Alkaline Phosphatase: 40 U/L (ref 38–126)
Anion gap: 11 (ref 5–15)
BUN: 9 mg/dL (ref 6–20)
CO2: 19 mmol/L — ABNORMAL LOW (ref 22–32)
Calcium: 9.5 mg/dL (ref 8.9–10.3)
Chloride: 106 mmol/L (ref 98–111)
Creatinine, Ser: 0.87 mg/dL (ref 0.44–1.00)
GFR, Estimated: >60 mL/min (ref >60)
Glucose, Bld: 92 mg/dL (ref 70–99)
Potassium: 4.3 mmol/L (ref 3.5–5.1)
Sodium: 136 mmol/L (ref 135–145)
Total Bilirubin: <0.1 mg/dL — ABNORMAL LOW (ref 0.3–1.2)
Total Protein: 6.8 g/dL (ref 6.5–8.1)

## 2022-12-09 LAB — URINALYSIS, ROUTINE W REFLEX MICROSCOPIC
Bilirubin Urine: NEGATIVE
Glucose, UA: NEGATIVE mg/dL
Ketones, ur: 5 mg/dL — AB
Leukocytes,Ua: NEGATIVE
Nitrite: NEGATIVE
Protein, ur: NEGATIVE mg/dL
RBC / HPF: >50 RBC/hpf (ref 0–5)
Specific Gravity, Urine: 1.021 (ref 1.005–1.030)
pH: 5 (ref 5.0–8.0)

## 2022-12-09 LAB — CBC
HCT: 40.5 % (ref 36.0–46.0)
Hemoglobin: 13.1 g/dL (ref 12.0–15.0)
MCH: 27.8 pg (ref 26.0–34.0)
MCHC: 32.3 g/dL (ref 30.0–36.0)
MCV: 85.8 fL (ref 80.0–100.0)
Platelets: 326 10*3/uL (ref 150–400)
RBC: 4.72 MIL/uL (ref 3.87–5.11)
RDW: 11.9 % (ref 11.5–15.5)
WBC: 11.3 10*3/uL — ABNORMAL HIGH (ref 4.0–10.5)
nRBC: 0 % (ref 0.0–0.2)

## 2022-12-09 LAB — POCT URINALYSIS DIPSTICK, ED / UC
Bilirubin Urine: NEGATIVE
Glucose, UA: NEGATIVE mg/dL
Leukocytes,Ua: NEGATIVE
Nitrite: NEGATIVE
Protein, ur: NEGATIVE mg/dL
Specific Gravity, Urine: 1.025 (ref 1.005–1.030)
Urobilinogen, UA: 2 mg/dL — ABNORMAL HIGH (ref 0.0–1.0)
pH: 6 (ref 5.0–8.0)

## 2022-12-09 LAB — POC URINE PREG, ED: Preg Test, Ur: NEGATIVE

## 2022-12-09 LAB — I-STAT BETA HCG BLOOD, ED (MC, WL, AP ONLY): I-stat hCG, quantitative: <5 m[IU]/mL (ref <5)

## 2022-12-09 MED ORDER — NAPROXEN 250 MG PO TABS
500.0000 mg | ORAL_TABLET | Freq: Once | ORAL | Status: AC
  Administered 2022-12-09: 500 mg via ORAL
  Filled 2022-12-09: qty 2

## 2022-12-09 MED ORDER — MUPIROCIN 2 % EX OINT: 1.0000 | TOPICAL_OINTMENT | Freq: Two times a day (BID) | CUTANEOUS | 1 refills | Status: DC

## 2022-12-09 MED ORDER — CEPHALEXIN 500 MG PO CAPS: 500.0000 mg | ORAL_CAPSULE | Freq: Four times a day (QID) | ORAL | 0 refills | Status: DC

## 2022-12-09 NOTE — ED Provider Notes (Signed)
[facility] Provider Note   CSN: [id number] Arrival date & time: 12/09/22  1708     History  Chief Complaint  Patient presents with   Vaginal Bleeding    Ann Lin is a 22 y.o. female, no pertinent past medical history, who presents to the ED secondary to 3 weeks of lower  abdominal pain.  She states that she has been having cramping of her bilateral lower quadrants, that radiates to her back for the last 3 weeks, associated with nausea no vomiting.  States that she had not had a period for about a year, and now developed her period day ago.  Endorses heavy vaginal bleeding, no vaginal discharge.  No concern for STDs.  Also states that she has a burn on the right lower leg, and is concerned that it may be getting infected.  Denies any fevers or chills.     Home Medications Prior to Admission medications   Medication Sig Start Date End Date Taking? Authorizing Provider  cephALEXin (KEFLEX) 500 MG capsule Take 1 capsule (500 mg total) by mouth 4 (four) times daily. 12/09/22  Yes Maeli Spacek L, PA  etonogestrel-ethinyl estradiol (NUVARING) 0.12-0.015 MG/24HR vaginal ring Place vaginally. 04/10/22   [provider]  mupirocin ointment (BACTROBAN) 2 % Apply 1 Application topically 2 (two) times daily. 12/09/22   Raspet, Derry Skill, PA-C      Allergies    Patient has no known allergies.    Review of Systems   Review of Systems  Constitutional:  Negative for fever.  Gastrointestinal:  Positive for abdominal pain. Negative for nausea and vomiting.  Genitourinary:  Positive for vaginal bleeding.    Physical Exam Updated Vital Signs BP (!) 109/91 (BP Location: Right Arm)   Pulse 75   Temp 98.9 F (37.2 C)   Resp 18   Ht 5\' 6"  (1.676 m)   Wt 74.8 kg   SpO2 100%   BMI 26.63 kg/m  Physical Exam Vitals and nursing note reviewed.  Constitutional:      General: She is not in acute distress.    Appearance: She is well-developed.   HENT:     Head: Normocephalic and atraumatic.  Eyes:     Conjunctiva/sclera: Conjunctivae normal.  Cardiovascular:     Rate and Rhythm: Normal rate and regular rhythm.     Heart sounds: No murmur heard. Pulmonary:     Effort: Pulmonary effort is normal. No respiratory distress.     Breath sounds: Normal breath sounds.  Abdominal:     Palpations: Abdomen is soft.     Tenderness: There is abdominal tenderness in the right lower quadrant and left lower quadrant. There is no guarding or rebound.  Genitourinary:    Comments: Pt declined Musculoskeletal:        General: No swelling.     Cervical back: Neck supple.  Skin:    General: Skin is warm and dry.     Capillary Refill: Capillary refill takes less than 2 seconds.     Comments: Burn on the right posterior calf, with surrounding erythema, warmth.  No edema or fluctuance.  Neurological:     Mental Status: She is alert.  Psychiatric:        Mood and Affect: Mood normal.     ED Results / Procedures / Treatments   Labs (all labs ordered are listed, but only abnormal results are displayed) Labs Reviewed  CBC - Abnormal; Notable for the following components:  Result Value   WBC 11.3 (*)    All other components within normal limits  URINALYSIS, ROUTINE W REFLEX MICROSCOPIC - Abnormal; Notable for the following components:   APPearance HAZY (*)    Hgb urine dipstick LARGE (*)    Ketones, ur 5 (*)    Bacteria, UA RARE (*)    All other components within normal limits  COMPREHENSIVE METABOLIC PANEL - Abnormal; Notable for the following components:   CO2 19 (*)    Total Bilirubin <0.1 (*)    All other components within normal limits  I-STAT BETA HCG BLOOD, ED ([facility], [facility], [facility] ONLY)    EKG None  Radiology No results found.  Procedures Procedures   Medications Ordered in ED Medications  naproxen (NAPROSYN) tablet 500 mg (500 mg Oral Given 12/09/22 1902)    ED Course/ Medical Decision Making/ A&P                            Medical Decision Making Patient is a 22 year old female, here for bilateral lower quadrant abdominal pain that is been going on for the last 3 weeks, with associated cramping.  Has not had period for about a year.  Will obtain i-STAT to make sure she is not pregnant, she is not currently bleeding.  She denies any concerns for STDs.  Pain is cramping in nature, has not taken anything for the pain.  We will obtain basic labs, and for further evaluation.  Amount and/or Complexity of Data Reviewed Labs: ordered.    Details: Mild leukocytosis, negative pregnancy test Discussion of management or test interpretation with external provider(s): Discussed with patient and negative pregnancy test, vaginal bleeding, declined vaginal exam.  She does not have any guarding or rebound so thus I do not think a CT abdomen pelvis is required.  She declined STD testing, and states she is sure she does not have an STD.  Appears like she had this testing done earlier.  I do not believe that she has PID given she does not have fever, she has mild leukocytosis, and has no guarding or rebound on exam.  She is overall well-appearing.  Urinalysis is negative.  We will treat her with Keflex for her cellulitis, and discussed return precautions patient discharged home.  Outpatient transvaginal ultrasound recommended.  Risk Prescription drug management.    Final Clinical Impression(s) / ED Diagnoses Final diagnoses:  Vaginal bleeding  Abdominal cramping  Cellulitis of right lower extremity    Rx / DC Orders ED Discharge Orders          Ordered    cephALEXin (KEFLEX) 500 MG capsule  4 times daily        12/09/22 2202              Amos Gaber, Si Gaul, PA 12/09/22 2209    Leanord Asal K, DO 12/09/22 2245

## 2022-12-09 NOTE — ED Notes (Signed)
This RN went in the pts room to D/C the pt, pt and mother no longer in the room.
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
## 2022-12-09 NOTE — ED Triage Notes (Addendum)
Pt states she had episode of heavy menstrual bleeding after urgent care visit where pelvic exam was done to eval for abdominal pain x3 weeks.
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
## 2022-12-09 NOTE — ED Notes (Signed)
Pt changing now 

## 2022-12-09 NOTE — Discharge Instructions (Signed)
Your urine had a little bit of blood but was otherwise normal.  We will send this for culture and contact you when you start antibiotics.  We will contact you if your swab is positive and we need to arrange treatment.  Make sure that you are drinking plenty of fluid.  Wear loosefitting cotton underwear and use hypoallergenic soaps and detergents.  Follow-up with your OB/GYN as you may benefit from additional testing including potentially an ultrasound.  If you develop any worsening symptoms including abdominal pain, back pain, fever, nausea, vomiting you should be seen immediately.  This burn will probably take several weeks to heal.  Keep it clean with soap and water and apply mupirocin ointment twice daily.  You should not continue to use any hydrogen peroxide or alcohol.  If you have any changing symptoms including enlarging lesion, spread of redness, bleeding, drainage you should be seen immediately.

## 2022-12-09 NOTE — ED Notes (Signed)
Pt refused pelvic exam.

## 2022-12-09 NOTE — Discharge Instructions (Addendum)
Please follow-up outpatient for transvaginal pelvic ultrasound.  Return to the ER if you have worsening abdominal pain, intractable nausea or vomiting.  Make sure you are drinking lots of fluids, and resting.
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
## 2022-12-10 DIAGNOSIS — R102 Pelvic and perineal pain: Secondary | ICD-10-CM | POA: Diagnosis not present

## 2022-12-10 DIAGNOSIS — K5901 Slow transit constipation: Secondary | ICD-10-CM | POA: Diagnosis not present

## 2022-12-10 DIAGNOSIS — N921 Excessive and frequent menstruation with irregular cycle: Secondary | ICD-10-CM | POA: Diagnosis not present

## 2022-12-10 LAB — CERVICOVAGINAL ANCILLARY ONLY
Bacterial Vaginitis (gardnerella): NEGATIVE
Candida Glabrata: NEGATIVE
Candida Vaginitis: NEGATIVE
Chlamydia: NEGATIVE
Comment: NEGATIVE
Comment: NEGATIVE
Comment: NEGATIVE
Comment: NEGATIVE
Comment: NEGATIVE
Comment: NORMAL
Neisseria Gonorrhea: NEGATIVE
Trichomonas: NEGATIVE

## 2022-12-10 LAB — URINE CULTURE: Culture: NO GROWTH

## 2022-12-15 DIAGNOSIS — N921 Excessive and frequent menstruation with irregular cycle: Secondary | ICD-10-CM | POA: Diagnosis not present

## 2022-12-15 DIAGNOSIS — R102 Pelvic and perineal pain: Secondary | ICD-10-CM | POA: Diagnosis not present

## 2023-01-15 ENCOUNTER — Encounter: Payer: Self-pay | Admitting: Obstetrics and Gynecology

## 2023-01-15 ENCOUNTER — Ambulatory Visit (INDEPENDENT_AMBULATORY_CARE_PROVIDER_SITE_OTHER): Payer: BC Managed Care – PPO | Admitting: Obstetrics and Gynecology

## 2023-01-15 VITALS — BP 100/60 | Ht 66.0 in | Wt 163.0 lb

## 2023-01-15 DIAGNOSIS — Z124 Encounter for screening for malignant neoplasm of cervix: Secondary | ICD-10-CM | POA: Diagnosis not present

## 2023-01-15 DIAGNOSIS — N939 Abnormal uterine and vaginal bleeding, unspecified: Secondary | ICD-10-CM

## 2023-01-15 NOTE — Progress Notes (Signed)
Charlton Amor, MD   Chief Complaint  Patient presents with   Vaginal Bleeding    Hasn't had a period for almost 1 yr, started bleeding and hasn't stopped since approx 12/08/22, no abnormal pain    HPI:      [name] is a 22 y.o. No obstetric history on file. whose LMP was Patient's last menstrual period was 12/08/2022 (approximate)., presents today for NP eval/2nd opinion of menorrhagia/irregular bleeding. Has been seen at [facility].  Pt was having monthly menses with nuvaring prior to unplanned pregnancy 1/24 with subsequent EAB. Pt was given medication without results, so then had dilation and surg abortion. Restarted nuvaring, but now no bleeding with nuvaring on placebo wk. Had neg thyroid labs and neg GYN u/s with [facility]. Pt was amenorrheic until started unexpected heavy bleeding ~12/08/22 while nuvaring still in. Had pelvic pain/cramping and went to Urgent Care at that time; gushing blood started a few hours later and pelvic pain improved. Pt removed nuvaring a few days later and hasn't put another one back in . Was changing pads and tampons every 1-2 hrs on heavy days. Neg serum beta/CBC/STD testing 12/09/22, neg GYN u/s with EM=4.9 mm 12/15/22 at [facility]. Bleeding has slowed a little the past 2 days. No pelvic pain. Would like to restart nuvaring. No sickness/increased stress/wt changes prior to sx.  Pt was sexually active prior to bleeding, none since. No pain/bleeding with sex prior to AUB.    Past Medical History:  Diagnosis Date   No pertinent past medical history     Past Surgical History:  Procedure Laterality Date   DILATION AND CURETTAGE OF UTERUS     WISDOM TOOTH EXTRACTION      Family History  Problem Relation Age of Onset   Healthy Mother    Healthy Father    Colon cancer Paternal Grandfather     Social History   Socioeconomic History   Marital status: Single    Spouse name: Not on file   Number of children: Not on file   Years of education: Not on  file   Highest education level: Not on file  Occupational History   Not on file  Tobacco Use   Smoking status: Never   Smokeless tobacco: Never  Vaping Use   Vaping Use: Never used  Substance and Sexual Activity   Alcohol use: Yes    Comment: socially   Drug use: Not Currently   Sexual activity: Not Currently    Birth control/protection: Inserts    Comment: Nuvaring  Other Topics Concern   Not on file  Social History Narrative   ** Merged History Encounter **       Social Determinants of Health   Financial Resource Strain: Not on file  Food Insecurity: Not on file  Transportation Needs: Not on file  Physical Activity: Not on file  Stress: Not on file  Social Connections: Not on file  Intimate Partner Violence: Not on file    Outpatient Medications Prior to Visit  Medication Sig Dispense Refill   etonogestrel-ethinyl estradiol (NUVARING) 0.12-0.015 MG/24HR vaginal ring Place vaginally. (Patient not taking: Reported on 01/15/2023)     cephALEXin (KEFLEX) 500 MG capsule Take 1 capsule (500 mg total) by mouth 4 (four) times daily. 20 capsule 0   mupirocin ointment (BACTROBAN) 2 % Apply 1 Application topically 2 (two) times daily. 22 g 1   No facility-administered medications prior to visit.  ROS:  Review of Systems  Constitutional:  Negative for fever.  Gastrointestinal:  Negative for blood in stool, constipation, diarrhea, nausea and vomiting.  Genitourinary:  Positive for menstrual problem. Negative for dyspareunia, dysuria, flank pain, frequency, hematuria, urgency, vaginal bleeding, vaginal discharge and vaginal pain.  Musculoskeletal:  Negative for back pain.  Skin:  Negative for rash.   BREAST: No symptoms   OBJECTIVE:   Vitals:  BP 100/60   Ht 5\' 6"  (1.676 m)   Wt 163 lb (73.9 kg)   LMP 12/08/2022 (Approximate)   BMI 26.31 kg/m   Physical Exam Vitals reviewed.  Constitutional:      Appearance: She is well-developed.  Pulmonary:     Effort:  Pulmonary effort is normal.  Genitourinary:    General: Normal vulva.     Pubic Area: No rash.      Labia:        Right: No rash, tenderness or lesion.        Left: No rash, tenderness or lesion.      Vagina: Bleeding present. No vaginal discharge, erythema or tenderness.     Cervix: Normal.     Uterus: Normal. Not enlarged and not tender.      Adnexa: Right adnexa normal and left adnexa normal.       Right: No mass or tenderness.         Left: No mass or tenderness.       Comments: SMALL AMT BLOOD IN VAULT Musculoskeletal:        General: Normal range of motion.     Cervical back: Normal range of motion.  Skin:    General: Skin is warm and dry.  Neurological:     General: No focal deficit present.     Mental Status: She is alert and oriented to person, place, and time.  Psychiatric:        Mood and Affect: Mood normal.        Behavior: Behavior normal.        Thought Content: Thought content normal.        Judgment: Judgment normal.    Assessment/Plan: Abnormal uterine bleeding (AUB) - Plan: CBC with Differential/Platelet; sx improving; had neg eval so sx hormonal most likely. Pt's questions answered/reassured. Check CBC since done prior to month long heavy bleeding. Will f/u with results. Replace nuvaring now, condoms for 1 wk. F/u in a couple months for further eval if irreg bleeding persists.   Cervical cancer screening - Plan: IGP, rfx Aptima HPV ASCU    Return if symptoms worsen or fail to improve.  Remmington Teters B. Sharief Wainwright, PA-C 01/15/2023 4:40 PM

## 2023-01-15 NOTE — Patient Instructions (Signed)
I value your feedback and you entrusting us with your care. If you get a [place] patient survey, I would appreciate you taking the time to let us know about your experience today. Thank you! ? ? ?

## 2023-01-16 LAB — CBC WITH DIFFERENTIAL/PLATELET
Basophils Absolute: 0.1 10*3/uL (ref 0.0–0.2)
Basos: 1 %
EOS (ABSOLUTE): 0.1 10*3/uL (ref 0.0–0.4)
Eos: 1 %
Hematocrit: 38 % (ref 34.0–46.6)
Hemoglobin: 12.6 g/dL (ref 11.1–15.9)
Immature Grans (Abs): 0 10*3/uL (ref 0.0–0.1)
Immature Granulocytes: 0 %
Lymphocytes Absolute: 3.6 10*3/uL — ABNORMAL HIGH (ref 0.7–3.1)
Lymphs: 44 %
MCH: 28.1 pg (ref 26.6–33.0)
MCHC: 33.2 g/dL (ref 31.5–35.7)
MCV: 85 fL (ref 79–97)
Monocytes Absolute: 0.5 10*3/uL (ref 0.1–0.9)
Monocytes: 6 %
Neutrophils Absolute: 3.9 10*3/uL (ref 1.4–7.0)
Neutrophils: 48 %
Platelets: 299 10*3/uL (ref 150–450)
RBC: 4.49 x10E6/uL (ref 3.77–5.28)
RDW: 11.8 % (ref 11.7–15.4)
WBC: 8.1 10*3/uL (ref 3.4–10.8)

## 2023-01-16 NOTE — Progress Notes (Signed)
Pls let pt know she is not anemic even though had bleeding for a month. F/u prn

## 2023-01-21 DIAGNOSIS — F332 Major depressive disorder, recurrent severe without psychotic features: Secondary | ICD-10-CM | POA: Diagnosis not present

## 2023-01-21 LAB — IGP, RFX APTIMA HPV ASCU

## 2023-01-22 DIAGNOSIS — F332 Major depressive disorder, recurrent severe without psychotic features: Secondary | ICD-10-CM | POA: Diagnosis not present

## 2023-01-26 DIAGNOSIS — F331 Major depressive disorder, recurrent, moderate: Secondary | ICD-10-CM | POA: Diagnosis not present

## 2023-01-27 DIAGNOSIS — F332 Major depressive disorder, recurrent severe without psychotic features: Secondary | ICD-10-CM | POA: Diagnosis not present

## 2023-02-03 DIAGNOSIS — F332 Major depressive disorder, recurrent severe without psychotic features: Secondary | ICD-10-CM | POA: Diagnosis not present

## 2023-02-03 DIAGNOSIS — F331 Major depressive disorder, recurrent, moderate: Secondary | ICD-10-CM | POA: Diagnosis not present

## 2023-02-10 DIAGNOSIS — F331 Major depressive disorder, recurrent, moderate: Secondary | ICD-10-CM | POA: Diagnosis not present

## 2023-03-04 DIAGNOSIS — F331 Major depressive disorder, recurrent, moderate: Secondary | ICD-10-CM | POA: Diagnosis not present

## 2023-03-11 DIAGNOSIS — F331 Major depressive disorder, recurrent, moderate: Secondary | ICD-10-CM | POA: Diagnosis not present

## 2023-03-25 DIAGNOSIS — F331 Major depressive disorder, recurrent, moderate: Secondary | ICD-10-CM | POA: Diagnosis not present

## 2023-12-17 ENCOUNTER — Ambulatory Visit: Payer: BC Managed Care – PPO | Admitting: Dermatology

## 2023-12-17 ENCOUNTER — Encounter: Payer: Self-pay | Admitting: Dermatology

## 2023-12-17 VITALS — BP 142/79 | HR 89

## 2023-12-17 DIAGNOSIS — L810 Postinflammatory hyperpigmentation: Secondary | ICD-10-CM

## 2023-12-17 DIAGNOSIS — L700 Acne vulgaris: Secondary | ICD-10-CM

## 2023-12-17 MED ORDER — CLINDAMYCIN PHOSPHATE 1 % EX SWAB: CUTANEOUS | 3 refills | Status: AC

## 2023-12-17 MED ORDER — TRETINOIN 0.025 % EX CREA: TOPICAL_CREAM | CUTANEOUS | 3 refills | Status: DC

## 2023-12-17 NOTE — Progress Notes (Signed)
   New Patient Visit   Subjective  Ann Lin is a 23 y.o. female who presents for the following: Acne Vulgaris with PIH. Face. Dur: ~7 months. Did have as a teenager but improved. Flared over past several months but is now under control, C/O dark spots left behind. Previous dermatologist did not prescribe a retinoid due to eczema flare on eyelids, did prescribe Spironolactone 50 mg daily. States she took it ~2 months, was not consistent. Washes with OTC BPO, Panoxyl, uses [employer] products- salicyclic acid, glycolic acid and niacinamide, Bio-oil. Has been using Eucerin with sunscreen. Has used OTC treatments to help lighten dark areas.    The following portions of the chart were reviewed this encounter and updated as appropriate: medications, allergies, medical history  Review of Systems:  No other skin or systemic complaints except as noted in HPI or Assessment and Plan.  Objective  Well appearing patient in no apparent distress; mood and affect are within normal limits.  Areas Examined: Face, chest and back  Relevant exam findings are noted in the Assessment and Plan.   Assessment & Plan    ACNE VULGARIS with PIH  - Assessment: Patient presents with acne leaving dark spots, indicating post-inflammatory hyperpigmentation. Previous treatment included spironolactone for 2 months, but patient was inconsistent with use. Current skincare regimen includes Panoxyl, glycolic acid toner, salicylic acid solution, niacinamide, and moisturizer from [employer] product line.   - Plan:    Continue:     - Panoxyl face wash     - Niacinamide    Discontinue:     - Glycolic acid     - Salicylic acid     - Bio oil    Morning regimen:     - Panoxyl wash     - Clindamycin 1% swabs     - Niacinamide serum     - Sunscreen    Evening regimen:     - Gentle cleanser (CeraVe Cream De Foam or La Roche-Posay Hydrating)     - Niacinamide serum     - Tretinoin (Retin-A 0.025%)     - Eucerin  Radiant Tone    Apply Aquaphor or Vaseline on eyelids and lips to prevent irritation    Use desonide ointment for no more than a week if irritation occurs    Recommend [place] sunscreen for daily use and Neutrogena mineral sunscreen for extended sun exposure    Reapply sunscreen every 3 hours    Consider restarting spironolactone if needed at follow-up  Return in about 4 months (around 04/17/2024) for Acne Follow Up.  I, Darcie Easterly, CMA, am acting as scribe for [employer], DO.   Documentation: I have reviewed the above documentation for accuracy and completeness, and I agree with the above.  Louana Roup, DO

## 2023-12-17 NOTE — Patient Instructions (Addendum)
 Hello Ann Lin,  Thank you for visiting today. Here is a summary of the key instructions:  - Medications:   - Stop using glycolic acid, salicylic acid, and bio oil   - Apply clindamycin 1% swabs in the morning   - Apply tretinoin (Retin-A 0.025%) at night, use a pea-sized amount   - Use desonide ointment for irritation, no more than a week  - Morning Skin Care Routine:   - Wash face with Panoxyl   - Apply clindamycin 1% swabs   - Apply niacinamide serum   - Apply sunscreen  - Night Skin Care Routine:   - Wash face with a gentle cleanser (CeraVe Cream De Foam or La Roche-Posay Hydrating)   - Apply niacinamide serum   - Apply tretinoin (Retin-A 0.025%)   - Apply Eucerin Radiant Tone  - Sun Protection:   - Use sunscreen daily   - Reapply sunscreen every 3 hours   - Use [place] sunscreen for daily use   - Use Neutrogena mineral sunscreen for long sun exposure  - Additional Instructions:   - Apply Aquaphor or Vaseline on eyelids and lips to prevent irritation   - Continue using Panoxyl for face wash   - Continue using niacinamide serum  - Follow-up: Return for a follow-up appointment in 4 months  - Contact: Send a message on MyChart if you have any questions  Please reach out if you have any questions or concerns.  Warm regards,  Dr. Langston Reusing, Dermatology          Recommended Sunscreen       Important Information  Due to recent changes in healthcare laws, you may see results of your pathology and/or laboratory studies on MyChart before the doctors have had a chance to review them. We understand that in some cases there may be results that are confusing or concerning to you. Please understand that not all results are received at the same time and often the doctors may need to interpret multiple results in order to provide you with the best plan of care or course of treatment. Therefore, we ask that you please give [place] 2 business days to thoroughly review all  your results before contacting the office for clarification. Should we see a critical lab result, you will be contacted sooner.   If You Need Anything After Your Visit  If you have any questions or concerns for your doctor, please call our main line at [phone] If no one answers, please leave a voicemail as directed and we will return your call as soon as possible. Messages left after 4 pm will be answered the following business day.   You may also send [place] a message via MyChart. We typically respond to MyChart messages within 1-2 business days.  For prescription refills, please ask your pharmacy to contact our office. Our fax number is [phone].  If you have an urgent issue when the clinic is closed that cannot wait until the next business day, you can page your doctor at the number below.    Please note that while we do our best to be available for urgent issues outside of office hours, we are not available 24/7.   If you have an urgent issue and are unable to reach [place], you may choose to seek medical care at your doctor's office, retail clinic, urgent care center, or emergency room.  If you have a medical emergency, please immediately call 911 or go to the emergency department. In the event  of inclement weather, please call our main line at [phone] for an update on the status of any delays or closures.  Dermatology Medication Tips: Please keep the boxes that topical medications come in in order to help keep track of the instructions about where and how to use these. Pharmacies typically print the medication instructions only on the boxes and not directly on the medication tubes.   If your medication is too expensive, please contact our office at [phone] or send [place] a message through MyChart.   We are unable to tell what your co-pay for medications will be in advance as this is different depending on your insurance coverage. However, we may be able to find a substitute  medication at lower cost or fill out paperwork to get insurance to cover a needed medication.   If a prior authorization is required to get your medication covered by your insurance company, please allow [place] 1-2 business days to complete this process.  Drug prices often vary depending on where the prescription is filled and some pharmacies may offer cheaper prices.  The website www.goodrx.com contains coupons for medications through different pharmacies. The prices here do not account for what the cost may be with help from insurance (it may be cheaper with your insurance), but the website can give you the price if you did not use any insurance.  - You can print the associated coupon and take it with your prescription to the pharmacy.  - You may also stop by our office during regular business hours and pick up a GoodRx coupon card.  - If you need your prescription sent electronically to a different pharmacy, notify our office through [facility] or by phone at [phone]

## 2023-12-21 ENCOUNTER — Other Ambulatory Visit: Payer: Self-pay

## 2023-12-21 MED ORDER — TRETINOIN 0.025 % EX CREA: TOPICAL_CREAM | Freq: Every day | CUTANEOUS | 5 refills | Status: AC

## 2023-12-21 NOTE — Progress Notes (Signed)
 Needed brand only

## 2024-04-20 ENCOUNTER — Ambulatory Visit: Admitting: Dermatology

## 2024-06-01 ENCOUNTER — Other Ambulatory Visit: Payer: Self-pay

## 2024-06-01 NOTE — Telephone Encounter (Signed)
 Patient contacted office requesting refill for her Nuva Ring. Prescription was historically put I on 04/10/22, patient was last seen in office for AUB on 01/15/23, advised patient to schedule annual physical to discuss, appt has been scheduled for 06/30/24.

## 2024-06-02 ENCOUNTER — Other Ambulatory Visit: Payer: Self-pay | Admitting: Obstetrics and Gynecology

## 2024-06-02 MED ORDER — ETONOGESTREL-ETHINYL ESTRADIOL 0.12-0.015 MG/24HR VA RING: 1.0000 | VAGINAL_RING | VAGINAL | 0 refills | Status: DC

## 2024-06-30 ENCOUNTER — Ambulatory Visit: Admitting: Obstetrics and Gynecology

## 2024-07-11 NOTE — Progress Notes (Unsigned)
 PCP:  Dow Rolland SAILOR, MD   No chief complaint on file.    HPI:      [name] is a 23 y.o. G1P0010 whose LMP was No LMP recorded., presents today for her annual examination.  Her menses are {norm/abn:715}, lasting {number: 22536} days.  Dysmenorrhea {dysmen:716}. She {does:18564} have intermenstrual bleeding.  Sex activity: single partner, contraception - NuvaRing vaginal inserts. No pain/bleeding/dryness. Last Pap: 01/15/23 Results were: no abnormalities  Hx of STDs: {STD hx:14358}  There is no FH of breast cancer. There is no FH of ovarian cancer. The patient {does:18564} do self-breast exams.  Tobacco use: {tob:20664} Alcohol use: {Alcohol:11675} No drug use.  Exercise: {exercise:31265}  She {does:18564} get adequate calcium and Vitamin D in her diet.  There are no active problems to display for this patient.   Past Surgical History:  Procedure Laterality Date   DILATION AND CURETTAGE OF UTERUS     WISDOM TOOTH EXTRACTION      Family History  Problem Relation Age of Onset   Healthy Mother    Healthy Father    Colon cancer Paternal Grandfather     Social History   Socioeconomic History   Marital status: Single    Spouse name: Not on file   Number of children: Not on file   Years of education: Not on file   Highest education level: Not on file  Occupational History   Not on file  Tobacco Use   Smoking status: Never   Smokeless tobacco: Never  Vaping Use   Vaping status: Never Used  Substance and Sexual Activity   Alcohol use: Yes    Comment: socially   Drug use: Not Currently   Sexual activity: Not Currently    Birth control/protection: Inserts    Comment: Nuvaring  Other Topics Concern   Not on file  Social History Narrative   ** Merged History Encounter **       Social Drivers of Health   Financial Resource Strain: Low Risk  (09/17/2023)   Received from [facility]   Overall Financial Resource Strain (CARDIA)     Difficulty of Paying Living Expenses: Not hard at all  Food Insecurity: Low Risk  (03/17/2024)   Received from [facility]   Hunger Vital Sign    Within the past 12 months, you worried that your food would run out before you got money to buy more: Never true    Within the past 12 months, the food you bought just didn't last and you didn't have money to get more. : Never true  Transportation Needs: No Transportation Needs (03/17/2024)   Received from [facility]    In the past 12 months, has lack of reliable transportation kept you from medical appointments, meetings, work or from getting things needed for daily living? : No  Physical Activity: Inactive (02/08/2020)   Received from [facility]   Exercise Vital Sign    On average, how many days per week do you engage in moderate to strenuous exercise (like a brisk walk)?: 0 days    On average, how many minutes do you engage in exercise at this level?: 0 min  Stress: No Stress Concern Present (02/08/2020)   Received from [facility] - Occupational Stress Questionnaire    Feeling of Stress : Not at all  Social Connections: Moderately Isolated (02/08/2020)   Received from [facility]   Social Connection and Isolation Panel    In a typical week, how many times do you talk on the phone with family, friends, or neighbors?: More than three times a week    How often do you get together with friends or relatives?: More than three times a week    How often do you attend church or religious services?: More than 4 times per year    Do you belong to any clubs or organizations such as church groups, unions, fraternal or athletic groups, or school groups?: No    How often do you attend meetings of the clubs or organizations you belong to?: Never    Are you married, widowed, divorced, separated, never married, or living with a partner?: Never  married  Intimate Partner Violence: Not on file     Current Outpatient Medications:    clindamycin  (CLEOCIN  T) 1 % SWAB, Apply to face every morning., Disp: 60 each, Rfl: 3   etonogestrel -ethinyl estradiol  (NUVARING) 0.12-0.015 MG/24HR vaginal ring, Place 1 each vaginally every 28 (twenty-eight) days., Disp: 1 each, Rfl: 0   spironolactone (ALDACTONE) 50 MG tablet, Take 1 tablet by mouth daily. (Patient not taking: Reported on 12/17/2023), Disp: , Rfl:    tretinoin  (RETIN-A ) 0.025 % cream, Apply pea-sized amount to entire face at bedtime, wash off in morning., Disp: 45 g, Rfl: 3   tretinoin  (RETIN-A ) 0.025 % cream, Apply topically at bedtime., Disp: 45 g, Rfl: 5     ROS:  Review of Systems BREAST: No symptoms   Objective: There were no vitals taken for this visit.   OBGyn Exam  Results: No results found for this or any previous visit (from the past 24 hours).  Assessment/Plan: No diagnosis found.  No orders of the defined types were placed in this encounter.            GYN counsel {counseling: 16159}     F/U  No follow-ups on file.  Tisa Weisel B. Syrina Wake, PA-C 07/11/2024 3:27 PM

## 2024-07-12 ENCOUNTER — Ambulatory Visit (INDEPENDENT_AMBULATORY_CARE_PROVIDER_SITE_OTHER): Admitting: Obstetrics and Gynecology

## 2024-07-12 ENCOUNTER — Other Ambulatory Visit (HOSPITAL_COMMUNITY)
Admission: RE | Admit: 2024-07-12 | Discharge: 2024-07-12 | Disposition: A | Discharge status: Home / Self Care | Source: Ambulatory Visit | Attending: Obstetrics and Gynecology | Admitting: Obstetrics and Gynecology

## 2024-07-12 ENCOUNTER — Encounter: Payer: Self-pay | Admitting: Obstetrics and Gynecology

## 2024-07-12 VITALS — BP 129/82 | HR 106 | Ht 64.0 in | Wt 169.0 lb

## 2024-07-12 DIAGNOSIS — Z01419 Encounter for gynecological examination (general) (routine) without abnormal findings: Secondary | ICD-10-CM

## 2024-07-12 DIAGNOSIS — Z113 Encounter for screening for infections with a predominantly sexual mode of transmission: Secondary | ICD-10-CM | POA: Diagnosis present

## 2024-07-12 DIAGNOSIS — Z3044 Encounter for surveillance of vaginal ring hormonal contraceptive device: Secondary | ICD-10-CM

## 2024-07-12 MED ORDER — ETONOGESTREL-ETHINYL ESTRADIOL 0.12-0.015 MG/24HR VA RING
1.0000 | VAGINAL_RING | VAGINAL | 3 refills | Status: AC
Start: 2024-07-12 — End: ?

## 2024-07-12 NOTE — Patient Instructions (Signed)
 I value your feedback and you entrusting [place] with your care. If you get a [place] patient survey, I would appreciate you taking the time to let us know about your experience today. Thank you! ? ? ?

## 2024-07-14 LAB — CERVICOVAGINAL ANCILLARY ONLY
Chlamydia: NEGATIVE
Comment: NEGATIVE
Comment: NORMAL
Neisseria Gonorrhea: NEGATIVE
